# Patient Record
Sex: Male | Born: 1970 | Race: White | Hispanic: No | Marital: Single | Smoking: Current every day smoker
Health system: Southern US, Community
[De-identification: ages and names within clinical notes are randomized; demographics above are authoritative.]

## PROBLEM LIST (undated history)

## (undated) DIAGNOSIS — K5090 Crohn's disease, unspecified, without complications: Secondary | ICD-10-CM

## (undated) HISTORY — PX: LUMBAR LAMINECTOMY: SHX95

---

## 2005-09-12 ENCOUNTER — Ambulatory Visit: Payer: Self-pay | Admitting: Internal Medicine

## 2005-09-24 ENCOUNTER — Ambulatory Visit: Payer: Self-pay | Admitting: Unknown Physician Specialty

## 2006-12-09 ENCOUNTER — Inpatient Hospital Stay: Payer: Self-pay | Admitting: Surgery

## 2006-12-18 ENCOUNTER — Ambulatory Visit: Payer: Self-pay | Admitting: Surgery

## 2006-12-21 ENCOUNTER — Other Ambulatory Visit: Payer: Self-pay

## 2006-12-21 ENCOUNTER — Inpatient Hospital Stay: Payer: Self-pay | Admitting: *Deleted

## 2007-01-26 ENCOUNTER — Ambulatory Visit: Payer: Self-pay | Admitting: Gastroenterology

## 2009-04-17 IMAGING — CR DG ABDOMEN 1V
1 series · 2 of 2 positions shown · non-contrast
Comparison: none

REASON FOR EXAM: Abdominal pain, diarrhea
COMMENTS:

[Series 1: view not recorded · 0.17mm/px · 2 of 2 slices shown]
[im 1/2]
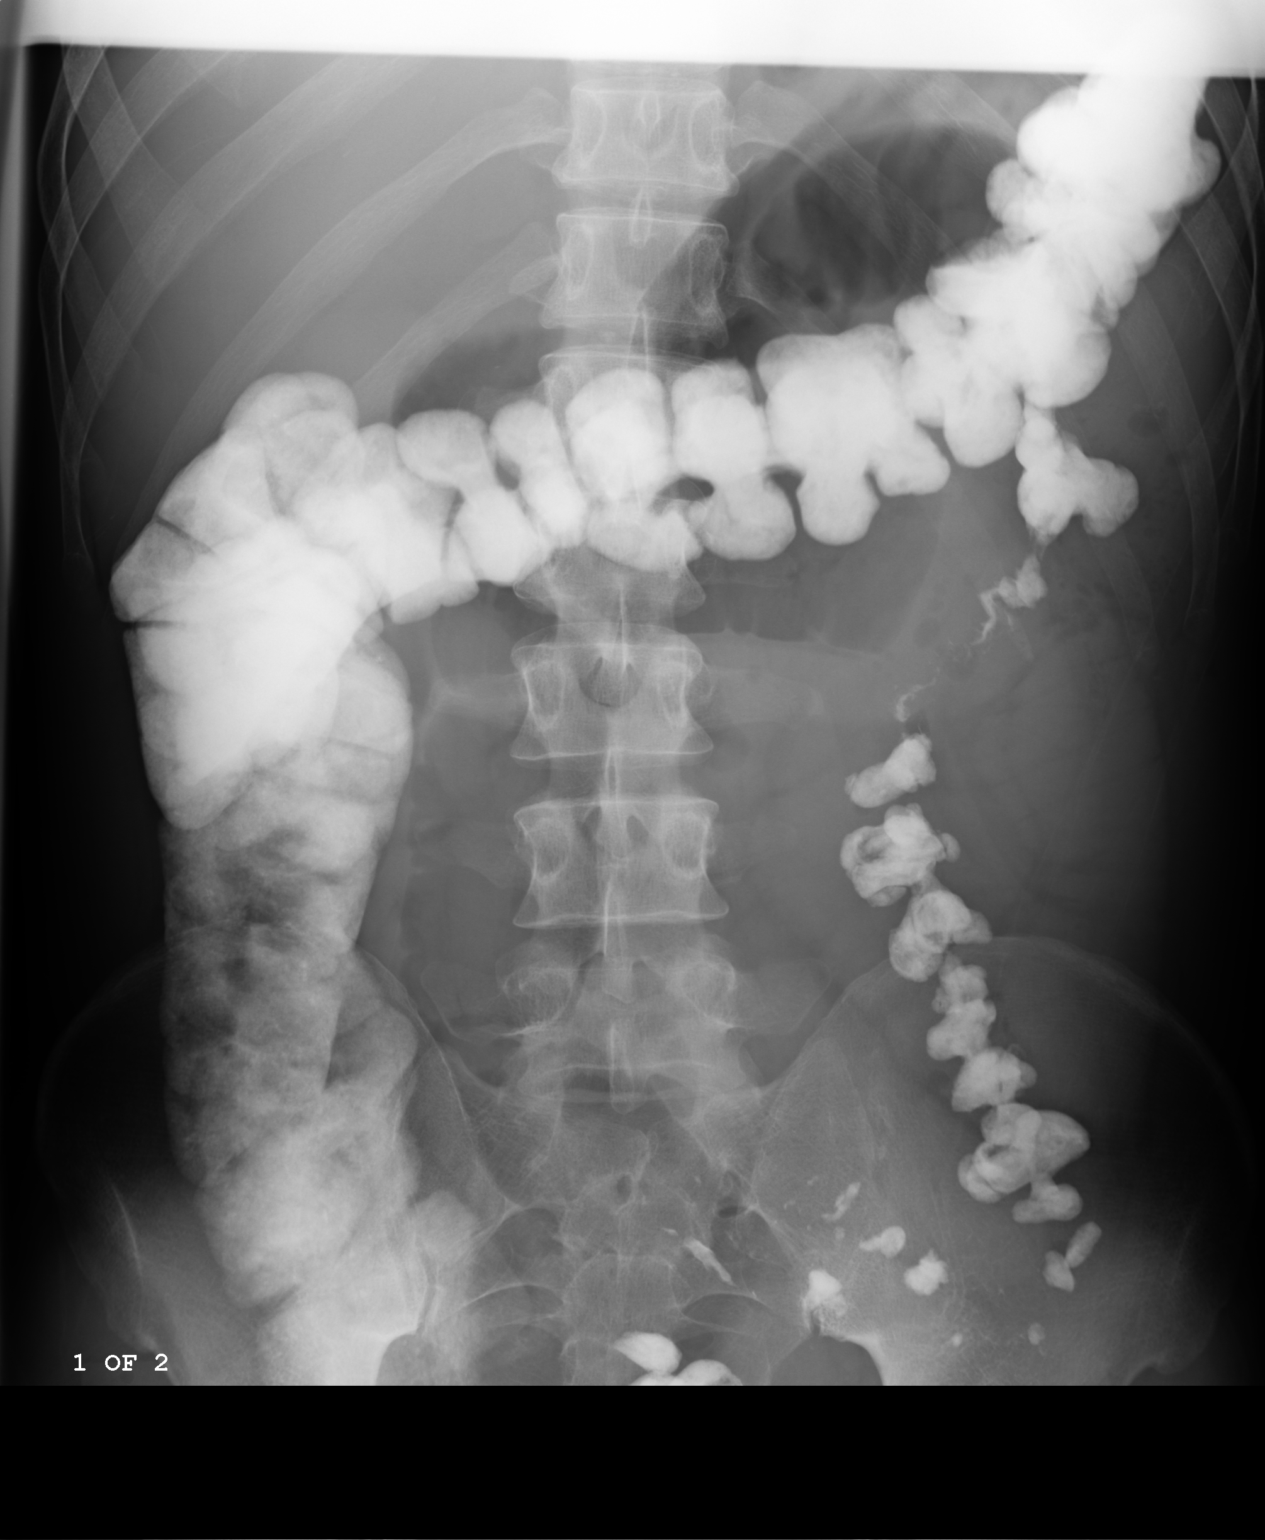
[im 2/2]
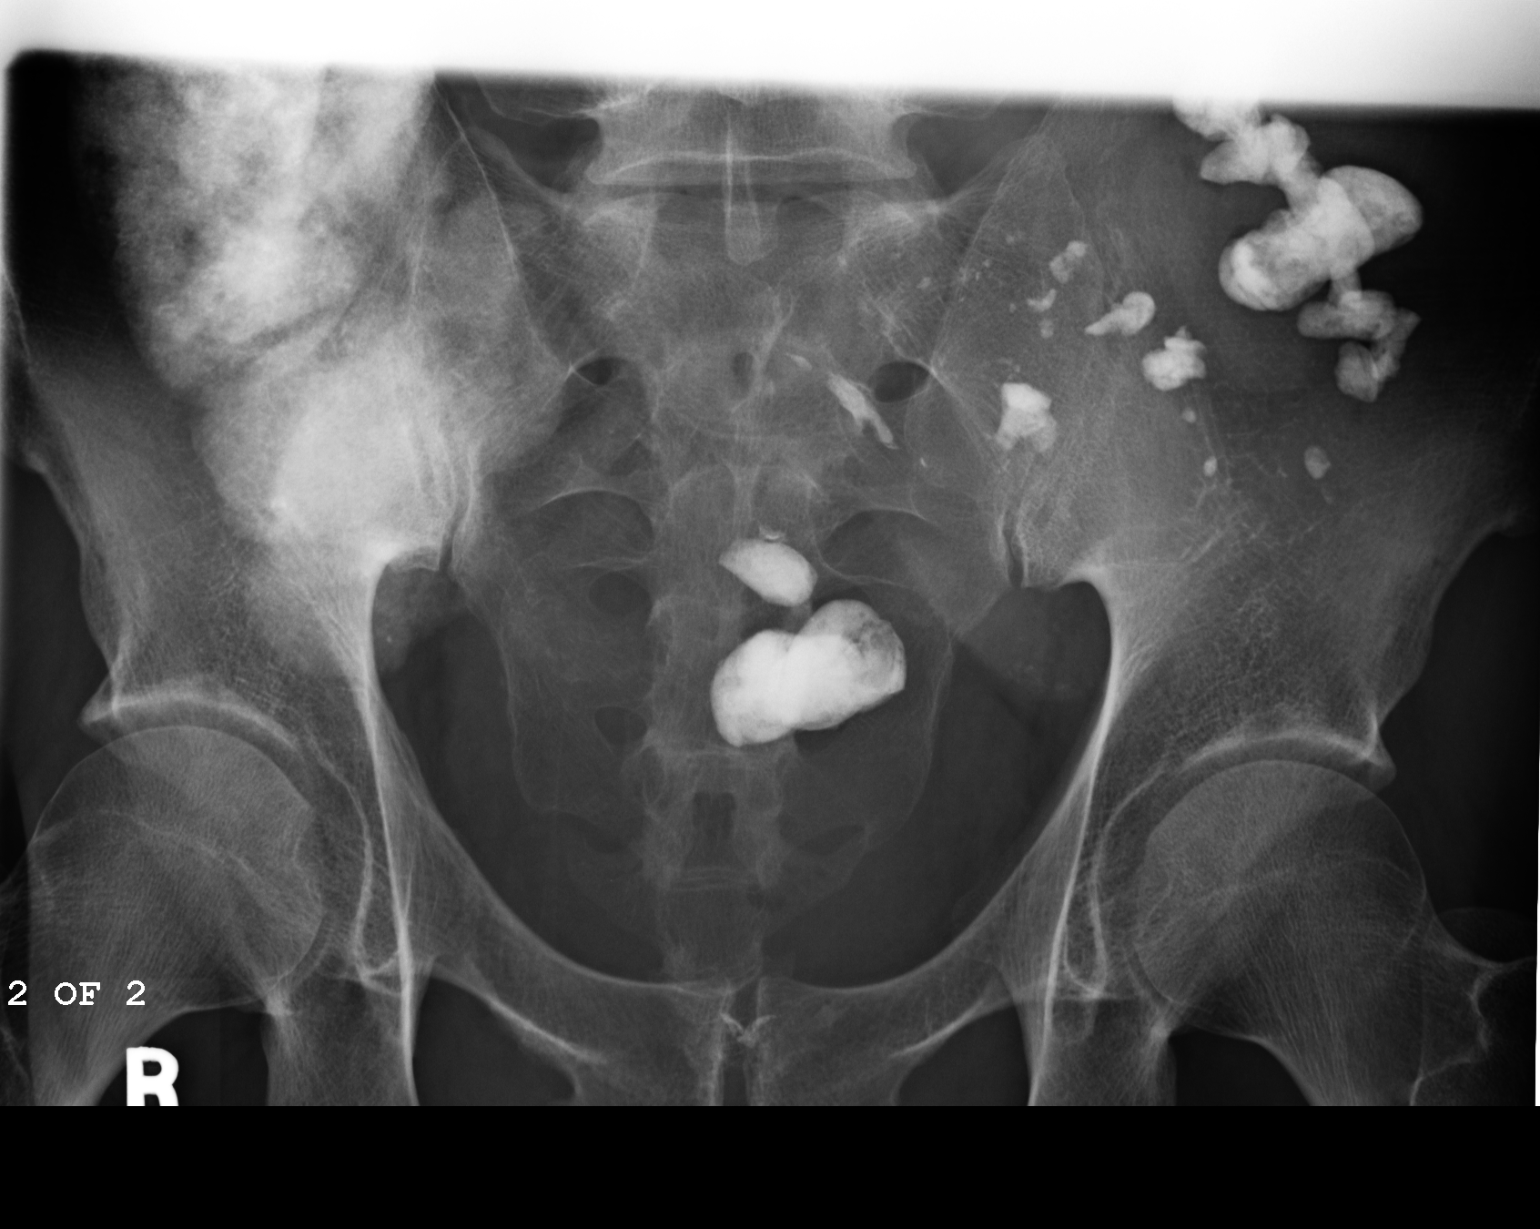

[2 of 2 positions shown; findings below may reference images not displayed]

PROCEDURE:     DXR - DXR KIDNEY URETER BLADDER  - December 22, 2006  [DATE]

RESULT:     AP view of the abdomen shows contrast in the colon compatible
with residual contrast from prior GI studies and prior CT. On the current
exam, no dilated loops of bowel are seen to indicate bowel obstruction. No
abnormal intra-abdominal calcifications are identified.
IMPRESSION: 1.  There is residual barium in the colon.
2.  No specific abnormalities are noted on plain film examination.

## 2014-11-15 ENCOUNTER — Encounter: Payer: Self-pay | Admitting: Emergency Medicine

## 2014-11-15 ENCOUNTER — Ambulatory Visit
Admission: EM | Admit: 2014-11-15 | Discharge: 2014-11-15 | Disposition: A | Discharge status: Home / Self Care | Payer: Managed Care, Other (non HMO) | Attending: Family Medicine | Admitting: Family Medicine

## 2014-11-15 DIAGNOSIS — K047 Periapical abscess without sinus: Secondary | ICD-10-CM

## 2014-11-15 HISTORY — DX: Crohn's disease, unspecified, without complications: K50.90

## 2014-11-15 MED ORDER — AMOXICILLIN-POT CLAVULANATE 875-125 MG PO TABS
1.0000 | ORAL_TABLET | Freq: Two times a day (BID) | ORAL | Status: DC
Start: 2014-11-15 — End: 2022-11-07

## 2014-11-15 MED ORDER — TRAMADOL HCL 50 MG PO TABS: 50.0000 mg | ORAL_TABLET | Freq: Four times a day (QID) | ORAL | Status: DC | PRN

## 2014-11-15 MED ORDER — MELOXICAM 15 MG PO TABS: 15.0000 mg | ORAL_TABLET | Freq: Every day | ORAL | Status: DC

## 2014-11-15 NOTE — Discharge Instructions (Signed)
Dental Abscess °A dental abscess is a collection of infected fluid (pus) from a bacterial infection in the inner part of the tooth (pulp). It usually occurs at the end of the tooth's root.  °CAUSES  °· Severe tooth decay. °· Trauma to the tooth that allows bacteria to enter into the pulp, such as a broken or chipped tooth. °SYMPTOMS  °· Severe pain in and around the infected tooth. °· Swelling and redness around the abscessed tooth or in the mouth or face. °· Tenderness. °· Pus drainage. °· Bad breath. °· Bitter taste in the mouth. °· Difficulty swallowing. °· Difficulty opening the mouth. °· Nausea. °· Vomiting. °· Chills. °· Swollen neck glands. °DIAGNOSIS  °· A medical and dental history will be taken. °· An examination will be performed by tapping on the abscessed tooth. °· X-rays may be taken of the tooth to identify the abscess. °TREATMENT °The goal of treatment is to eliminate the infection. You may be prescribed antibiotic medicine to stop the infection from spreading. A root canal may be performed to save the tooth. If the tooth cannot be saved, it may be pulled (extracted) and the abscess may be drained.  °HOME CARE INSTRUCTIONS °· Only take over-the-counter or prescription medicines for pain, fever, or discomfort as directed by your caregiver. °· Rinse your mouth (gargle) often with salt water (¼ tsp salt in 8 oz [250 ml] of warm water) to relieve pain or swelling. °· Do not drive after taking pain medicine (narcotics). °· Do not apply heat to the outside of your face. °· Return to your dentist for further treatment as directed. °SEEK MEDICAL CARE IF: °· Your pain is not helped by medicine. °· Your pain is getting worse instead of better. °SEEK IMMEDIATE MEDICAL CARE IF: °· You have a fever or persistent symptoms for more than 2-3 days. °· You have a fever and your symptoms suddenly get worse. °· You have chills or a very bad headache. °· You have problems breathing or swallowing. °· You have trouble  opening your mouth. °· You have swelling in the neck or around the eye. °Document Released: 03/25/2005 Document Revised: 12/18/2011 Document Reviewed: 07/03/2010 °[employer]® Patient Information ©2015 [employer]. This information is not intended to replace advice given to you by your health care provider. Make sure you discuss any questions you have with your health care provider. ° °Dental Care and Dentist Visits °Dental care supports good overall health. Regular dental visits can also help you avoid dental pain, bleeding, infection, and other more serious health problems in the future. It is important to keep the mouth healthy because diseases in the teeth, gums, and other oral tissues can spread to other areas of the body. Some problems, such as diabetes, heart disease, and pre-term labor have been associated with poor oral health.  °See your dentist every 6 months. If you experience emergency problems such as a toothache or broken tooth, go to the dentist right away. If you see your dentist regularly, you may catch problems early. It is easier to be treated for problems in the early stages.  °WHAT TO EXPECT AT A DENTIST VISIT  °Your dentist will look for many common oral health problems and recommend proper treatment. At your regular dental visit, you can expect: °· Gentle cleaning of the teeth and gums. This includes scraping and polishing. This helps to remove the sticky substance around the teeth and gums (plaque). Plaque forms in the mouth shortly after eating. Over time, plaque hardens   on the teeth as tartar. If tartar is not removed regularly, it can cause problems. Cleaning also helps remove stains. °· Periodic X-rays. These pictures of the teeth and supporting bone will help your dentist assess the health of your teeth. °· Periodic fluoride treatments. Fluoride is a natural mineral shown to help strengthen teeth. Fluoride treatment involves applying a fluoride gel or varnish to the teeth. It is most  commonly done in children. °· Examination of the mouth, tongue, jaws, teeth, and gums to look for any oral health problems, such as: °¨ Cavities (dental caries). This is decay on the tooth caused by plaque, sugar, and acid in the mouth. It is best to catch a cavity when it is small. °¨ Inflammation of the gums caused by plaque buildup (gingivitis). °¨ Problems with the mouth or malformed or misaligned teeth. °¨ Oral cancer or other diseases of the soft tissues or jaws.  °KEEP YOUR TEETH AND GUMS HEALTHY °For healthy teeth and gums, follow these general guidelines as well as your dentist's specific advice: °· Have your teeth professionally cleaned at the dentist every 6 months. °· Brush twice daily with a fluoride toothpaste. °· Floss your teeth daily.  °· Ask your dentist if you need fluoride supplements, treatments, or fluoride toothpaste. °· Eat a healthy diet. Reduce foods and drinks with added sugar. °· Avoid smoking. °TREATMENT FOR ORAL HEALTH PROBLEMS °If you have oral health problems, treatment varies depending on the conditions present in your teeth and gums. °· Your caregiver will most likely recommend good oral hygiene at each visit. °· For cavities, gingivitis, or other oral health disease, your caregiver will perform a procedure to treat the problem. This is typically done at a separate appointment. Sometimes your caregiver will refer you to another dental specialist for specific tooth problems or for surgery. °SEEK IMMEDIATE DENTAL CARE IF: °· You have pain, bleeding, or soreness in the gum, tooth, jaw, or mouth area. °· A permanent tooth becomes loose or separated from the gum socket. °· You experience a blow or injury to the mouth or jaw area. °Document Released: 12/05/2010 Document Revised: 06/17/2011 Document Reviewed: 12/05/2010 °Exit[employer]® Patient Information ©2015 [employer]. This information is not intended to replace advice given to you by your health care provider. Make sure you discuss any  questions you have with your health care provider. ° °

## 2014-11-15 NOTE — ED Notes (Signed)
Pt woke up this am with swelling left jaw

## 2014-11-15 NOTE — ED Provider Notes (Signed)
CSN: [id number]     Arrival date & time 11/15/14  4098 History   First MD Initiated Contact with Patient 11/15/14 [phone]     Chief Complaint  Patient presents with  . Oral Swelling   (Consider location/radiation/quality/duration/timing/severity/associated sxs/prior Treatment) HPI   This a 44 year old gentleman who presents with swelling of his left upper anterior face from a abscess tooth. He states that this is happened many times before his teeth are in terrible repair but he has not had any insurance nor money to have it fixed. Usually he takes antibiotic for 10 days and it improves. He states he has no allergies.  Past Medical History  Diagnosis Date  . Crohn's disease    Past Surgical History  Procedure Laterality Date  . Lumbar laminectomy     History reviewed. No pertinent family history. History  Substance Use Topics  . Smoking status: Current Every Day Smoker  . Smokeless tobacco: Never Used  . Alcohol Use: 0.6 oz/week    1 Cans of beer per week    Review of Systems  Constitutional: Negative for fever and chills.  HENT: Positive for dental problem.   All other systems reviewed and are negative.   Allergies  Review of patient's allergies indicates no known allergies.  Home Medications   Prior to Admission medications   Medication Sig Start Date End Date Taking? Authorizing Provider  amoxicillin-clavulanate (AUGMENTIN) 875-125 MG per tablet Take 1 tablet by mouth every 12 (twelve) hours. 11/15/14   Lutricia Feil, PA-C  meloxicam (MOBIC) 15 MG tablet Take 1 tablet (15 mg total) by mouth daily. 11/15/14   Lutricia Feil, PA-C  traMADol (ULTRAM) 50 MG tablet Take 1 tablet (50 mg total) by mouth every 6 (six) hours as needed. 11/15/14   Chrissie Noa Roemer, PA-C   BP 127/98 mmHg  Pulse 83  Temp(Src) 98 F (36.7 C) (Oral)  Resp 16  Ht 5\' 11"  (1.803 m)  Wt 140 lb (63.504 kg)  BMI 19.53 kg/m2  SpO2 100% Physical Exam  Constitutional: He is oriented to person, place,  and time. He appears well-developed and well-nourished.  HENT:  Head: Normocephalic and atraumatic.  Right Ear: External ear normal.  Left Ear: External ear normal.  The patient has obvious swelling of his left anterior upper lip and face. Is no adenopathy present. Dentition examination shows him to be in very poor shape almost all of his remaining teeth are rotted and several rotted at the gum line. None of his remaining teeth are viable.  Eyes: Pupils are equal, round, and reactive to light. Right eye exhibits no discharge. Left eye exhibits no discharge.  Neck: Neck supple. No thyromegaly present.  Cardiovascular: Normal rate, regular rhythm and normal heart sounds.  Exam reveals no gallop and no friction rub.   No murmur heard. Musculoskeletal: Normal range of motion. He exhibits no edema or tenderness.  Lymphadenopathy:    He has no cervical adenopathy.  Neurological: He is alert and oriented to person, place, and time.  Skin: Skin is warm and dry.  Psychiatric: He has a normal mood and affect. His behavior is normal. Judgment and thought content normal.  Nursing note and vitals reviewed.   ED Course  Procedures (including critical care time) Labs Review Labs Reviewed - No data to display  Imaging Review No results found.   MDM   1. Dental abscess    New Prescriptions   AMOXICILLIN-CLAVULANATE (AUGMENTIN) 875-125 MG PER TABLET    Take 1  tablet by mouth every 12 (twelve) hours.   MELOXICAM (MOBIC) 15 MG TABLET    Take 1 tablet (15 mg total) by mouth daily.   TRAMADOL (ULTRAM) 50 MG TABLET    Take 1 tablet (50 mg total) by mouth every 6 (six) hours as needed.  Plan: 1. Diagnosis reviewed with patient 2. rx as per orders; risks, benefits, potential side effects reviewed with patient 3. Recommend supportive treatment with fluids,heat /ice 4. F/u Dentist ASAP for extractions.Warned of possible complications re: heart vegetation,sepsis etc.      Lutricia Feil,  PA-C 11/15/14 1036

## 2022-11-07 ENCOUNTER — Ambulatory Visit
Admission: EM | Admit: 2022-11-07 | Discharge: 2022-11-07 | Disposition: A | Discharge status: Home / Self Care | Payer: Managed Care, Other (non HMO) | Attending: Family Medicine | Admitting: Family Medicine

## 2022-11-07 DIAGNOSIS — U071 COVID-19: Secondary | ICD-10-CM | POA: Diagnosis present

## 2022-11-07 LAB — SARS CORONAVIRUS 2 BY RT PCR: SARS Coronavirus 2 by RT PCR: POSITIVE — AB

## 2022-11-07 MED ORDER — IBUPROFEN 800 MG PO TABS
800.0000 mg | ORAL_TABLET | Freq: Once | ORAL | Status: AC
  Administered 2022-11-07: 800 mg via ORAL

## 2022-11-07 MED ORDER — IBUPROFEN 800 MG PO TABS: 800.0000 mg | ORAL_TABLET | Freq: Three times a day (TID) | ORAL | 0 refills | Status: AC

## 2022-11-07 MED ORDER — IPRATROPIUM BROMIDE 0.06 % NA SOLN: 2.0000 | Freq: Four times a day (QID) | NASAL | 12 refills | Status: AC

## 2022-11-07 NOTE — Discharge Instructions (Signed)

## 2022-11-07 NOTE — ED Triage Notes (Signed)
Pt c/o bodyaches,congestion,HA & fatigue x1 day. States he tested + for covid at home this AM. Has tried tylenol last dose around 0700.

## 2022-11-07 NOTE — ED Provider Notes (Signed)
[facility]    CSN: [id number] Arrival date & time: 11/07/22  0949      History   Chief Complaint Chief Complaint  Patient presents with   Generalized Body Aches   Nasal Congestion   Headache   Fatigue    HPI Scott Lopez is a 52 y.o. male.   HPI  History obtained from the patient. Scott Lopez presents for bodyaches, headache, chills, fatigue. He didn't check for fever but was found to be febrile in triage. Denies vomiting, nausea, diarrhea.  Endorses nasal congestion, slight cough, lightheadedness when he moves around.  He did a COVID home test yesterday and he was COVID positive.  No known sick contacts.       Past Medical History:  Diagnosis Date   Crohn's disease (HCC)     There are no problems to display for this patient.   Past Surgical History:  Procedure Laterality Date   LUMBAR LAMINECTOMY         Home Medications    Prior to Admission medications   Medication Sig Start Date End Date Taking? Authorizing Provider  ibuprofen (ADVIL) 800 MG tablet Take 1 tablet (800 mg total) by mouth 3 (three) times daily. 11/07/22  Yes Lennix Rotundo, DO  ipratropium (ATROVENT) 0.06 % nasal spray Place 2 sprays into both nostrils 4 (four) times daily. 11/07/22  Yes Katha Cabal, DO    Family History History reviewed. No pertinent family history.  Social History Social History   Tobacco Use   Smoking status: Every Day   Smokeless tobacco: Never  Substance Use Topics   Alcohol use: Yes    Alcohol/week: 1.0 standard drink of alcohol    Types: 1 Cans of beer per week     Allergies   Patient has no known allergies.   Review of Systems Review of Systems: negative unless otherwise stated in HPI.      Physical Exam Triage Vital Signs ED Triage Vitals  Encounter Vitals Group     BP      Systolic BP Percentile      Diastolic BP Percentile      Pulse      Resp      Temp      Temp src      SpO2      Weight      Height      Head Circumference       Peak Flow      Pain Score      Pain Loc      Pain Education      Exclude from Growth Chart    No data found.  Updated Vital Signs BP (!) 133/92 (BP Location: Right Arm)   Pulse (!) 113   Temp (!) 102.5 F (39.2 C) (Oral)   Resp 16   Ht 5' 11.5" (1.816 m)   Wt 59 kg   SpO2 95%   BMI 17.88 kg/m   Visual Acuity Right Eye Distance:   Left Eye Distance:   Bilateral Distance:    Right Eye Near:   Left Eye Near:    Bilateral Near:     Physical Exam GEN:     alert, non-toxic appearing male in no distress    HENT:  mucus membranes moist, clear nasal discharge EYES:   pupils equal and reactive, no scleral injection or discharge NECK:  normal ROM, no meningismus   RESP:  no increased work of breathing, clear to auscultation bilaterally CVS:  regular rhythm, tachycardic  Skin:   warm and dry, brisk cap refill     UC Treatments / Results  Labs (all labs ordered are listed, but only abnormal results are displayed) Labs Reviewed  SARS CORONAVIRUS 2 BY RT PCR - Abnormal; Notable for the following components:      Result Value   SARS Coronavirus 2 by RT PCR POSITIVE (*)    All other components within normal limits    EKG   Radiology No results found.  Procedures Procedures (including critical care time)  Medications Ordered in UC Medications  ibuprofen (ADVIL) tablet 800 mg (800 mg Oral Given 11/07/22 1011)    Initial Impression / Assessment and Plan / UC Course  I have reviewed the triage vital signs and the nursing notes.  Pertinent labs & imaging results that were available during my care of the patient were reviewed by me and considered in my medical decision making (see chart for details).       Pt is a 52 y.o. male who presents for 1 days of respiratory symptoms. Scott Lopez is febrile here (T 102.5 F) without recent antipyretics. Given 800 mg ibuprofen. He is tachycardic and satting 95% on room air. Overall pt is ill but non-toxic appearing, well  hydrated, without respiratory distress. Pulmonary exam is unremarkable.  COVID testing obtained and is positive. Pt interested in Paxlovid. He declined blood work to check his kidney function today and there is none available for review.  No Paxlovid prescribed.  Discussed symptomatic treatment.  Atrovent nasal spray for congestion. Isolation and quarantine guidelines discussed.  Work note offered but not needed.   Has history of chronic low back pain. Has been having increased pain. Body aches from COVID may also be exaggerating his back pain.  Prescribed ibuprofen for his chronic back pain.  Declines muscle relaxer. Typical duration of symptoms discussed.   Return and ED precautions given and voiced understanding. Discussed MDM, treatment plan and plan for follow-up with patient who agrees with plan.     Final Clinical Impressions(s) / UC Diagnoses   Final diagnoses:  COVID-19     Discharge Instructions      Your test for COVID-19 was positive, meaning that you were infected with the novel coronavirus and could give the germ to others.  The recommendations suggest returning to normal activities when, for at least 24 hours, symptoms are improving overall, and if a fever was present, it has been gone without use of a fever-reducing medication.  You should wear a mask for the next 5 days to prevent the spread of disease. Please continue good preventive care measures, including:  frequent hand-washing, avoid touching your face, cover coughs/sneezes, stay out of crowds and keep a 6 foot distance from others.  Go to the nearest hospital emergency room if fever/cough/breathlessness are severe or illness seems like a threat to life.  If your were prescribed medication. Stop by the pharmacy to pick it up. You can take Tylenol and/or Ibuprofen as needed for fever reduction and pain relief.    For cough: honey 1/2 to 1 teaspoon (you can dilute the honey in water or another fluid).  You can also use  guaifenesin and dextromethorphan for cough. You can use a humidifier for chest congestion and cough.  If you don't have a humidifier, you can sit in the bathroom with the hot shower running.      For sore throat: try warm salt water gargles, Mucinex sore throat cough drops or cepacol  lozenges, throat spray, warm tea or water with lemon/honey, popsicles or ice, or OTC cold relief medicine for throat discomfort. You can also purchase chloraseptic spray at the pharmacy or dollar store.   For congestion: take a daily anti-histamine like Zyrtec, Claritin, and a oral decongestant, such as pseudoephedrine.  You can also use Flonase 1-2 sprays in each nostril daily. Afrin is also a good option, if you do not have high blood pressure.    It is important to stay hydrated: drink plenty of fluids (water, gatorade/powerade/pedialyte, juices, or teas) to keep your throat moisturized and help further relieve irritation/discomfort.    Return or go to the Emergency Department if symptoms worsen or do not improve in the next few days      ED Prescriptions     Medication Sig Dispense Auth. Provider   ibuprofen (ADVIL) 800 MG tablet Take 1 tablet (800 mg total) by mouth 3 (three) times daily. 21 tablet Smitty Ackerley, DO   ipratropium (ATROVENT) 0.06 % nasal spray Place 2 sprays into both nostrils 4 (four) times daily. 15 mL Katha Cabal, DO      PDMP not reviewed this encounter.   Katha Cabal, DO 11/07/22 1053
